# Patient Record
Sex: Male | Born: 1990 | Race: Black or African American | Hispanic: No | Marital: Single | Smoking: Never smoker
Health system: Southern US, Community
[De-identification: ages and names within clinical notes are randomized; demographics above are authoritative.]

---

## 2013-03-15 ENCOUNTER — Emergency Department (INDEPENDENT_AMBULATORY_CARE_PROVIDER_SITE_OTHER)
Admission: EM | Admit: 2013-03-15 | Discharge: 2013-03-15 | Disposition: A | Discharge status: Home / Self Care | Payer: Managed Care, Other (non HMO) | Source: Home / Self Care | Attending: Family Medicine | Admitting: Family Medicine

## 2013-03-15 ENCOUNTER — Encounter (HOSPITAL_COMMUNITY): Payer: Self-pay | Admitting: Emergency Medicine

## 2013-03-15 DIAGNOSIS — L738 Other specified follicular disorders: Secondary | ICD-10-CM

## 2013-03-15 DIAGNOSIS — L678 Other hair color and hair shaft abnormalities: Secondary | ICD-10-CM

## 2013-03-15 DIAGNOSIS — L731 Pseudofolliculitis barbae: Secondary | ICD-10-CM

## 2013-03-15 LAB — POCT URINALYSIS DIP (DEVICE)
Hgb urine dipstick: NEGATIVE
Ketones, ur: NEGATIVE mg/dL
Protein, ur: NEGATIVE mg/dL
Specific Gravity, Urine: 1.025 (ref 1.005–1.030)
pH: 7 (ref 5.0–8.0)

## 2013-03-15 NOTE — ED Notes (Signed)
Patient in a hurry for another appointment-not sure how well he is listening

## 2013-03-15 NOTE — ED Notes (Signed)
Pt c/o 2 bumps on left side of groin area. Pt states the bumps are itching. Pt denies discharge and dysuria. Jan Ranson, SMA

## 2013-03-15 NOTE — ED Provider Notes (Signed)
Edwin Ramos is a 22 y.o. male who presents to Urgent Care today for left ingrown hair and penile pain. Patient noted a small ingrown hair on the left inguinal region. Additionally he had one episode of penile pain following sex last week. He denies any penile discharge testicle pain abdominal pain nausea vomiting or diarrhea. He has never had a sexually-transmitted infection. He feels well otherwise   History reviewed. No pertinent past medical history. History  Substance Use Topics  . Smoking status: Never Smoker   . Smokeless tobacco: Not on file  . Alcohol Use: Not on file   ROS as above Medications reviewed. No current facility-administered medications for this encounter.   No current outpatient prescriptions on file.    Exam:  BP 110/75  Pulse 60  Temp(Src) 98.4 F (36.9 C) (Oral)  Resp 16  SpO2 99% Gen: Well NAD GROIN: Normal appearing circumcised penis. Small band of tissue contacts the glans to the shaft of the penis on the dorsal aspect. No penile discharge.  Normal-appearing scrotum. Testes are normal and nontender.  No hernias present.  Small ingrown hair in the left inguinal area.   Results for orders placed during the hospital encounter of 03/15/13 (from the past 24 hour(s))  POCT URINALYSIS DIP (DEVICE)     Status: Abnormal   Collection Time    03/15/13 11:40 AM      Result Value Range   Glucose, UA NEGATIVE  NEGATIVE mg/dL   Bilirubin Urine NEGATIVE  NEGATIVE   Ketones, ur NEGATIVE  NEGATIVE mg/dL   Specific Gravity, Urine 1.025  1.005 - 1.030   Hgb urine dipstick NEGATIVE  NEGATIVE   pH 7.0  5.0 - 8.0   Protein, ur NEGATIVE  NEGATIVE mg/dL   Urobilinogen, UA 2.0 (*) 0.0 - 1.0 mg/dL   Nitrite NEGATIVE  NEGATIVE   Leukocytes, UA NEGATIVE  NEGATIVE   No results found.  Assessment and Plan: 23 y.o. male with left ingrown hair and penile pain.  Urine culture and urine cytology for gonorrhea Chlamydia Trichomonas are pending.  Warm compress for ingrown  hair.  Followup as needed Will call with result Discussed warning signs or symptoms. Please see discharge instructions. Patient expresses understanding.      Rodolph Bong, MD 03/15/13 [phone]

## 2014-03-17 ENCOUNTER — Encounter (HOSPITAL_COMMUNITY): Payer: Self-pay | Admitting: Emergency Medicine

## 2014-03-17 ENCOUNTER — Emergency Department (HOSPITAL_COMMUNITY): Payer: Managed Care, Other (non HMO)

## 2014-03-17 ENCOUNTER — Emergency Department (HOSPITAL_COMMUNITY)
Admission: EM | Admit: 2014-03-17 | Discharge: 2014-03-17 | Disposition: A | Discharge status: Home / Self Care | Payer: Managed Care, Other (non HMO) | Attending: Emergency Medicine | Admitting: Emergency Medicine

## 2014-03-17 DIAGNOSIS — S8990XA Unspecified injury of unspecified lower leg, initial encounter: Secondary | ICD-10-CM | POA: Diagnosis present

## 2014-03-17 DIAGNOSIS — Y92838 Other recreation area as the place of occurrence of the external cause: Secondary | ICD-10-CM

## 2014-03-17 DIAGNOSIS — Y9361 Activity, american tackle football: Secondary | ICD-10-CM | POA: Insufficient documentation

## 2014-03-17 DIAGNOSIS — S99919A Unspecified injury of unspecified ankle, initial encounter: Secondary | ICD-10-CM | POA: Diagnosis present

## 2014-03-17 DIAGNOSIS — X500XXA Overexertion from strenuous movement or load, initial encounter: Secondary | ICD-10-CM | POA: Insufficient documentation

## 2014-03-17 DIAGNOSIS — S99929A Unspecified injury of unspecified foot, initial encounter: Secondary | ICD-10-CM | POA: Diagnosis not present

## 2014-03-17 DIAGNOSIS — Y9239 Other specified sports and athletic area as the place of occurrence of the external cause: Secondary | ICD-10-CM | POA: Diagnosis not present

## 2014-03-17 DIAGNOSIS — M25561 Pain in right knee: Secondary | ICD-10-CM

## 2014-03-17 MED ORDER — OXYCODONE-ACETAMINOPHEN 5-325 MG PO TABS
1.0000 | ORAL_TABLET | Freq: Once | ORAL | Status: AC
  Administered 2014-03-17: 1 via ORAL
  Filled 2014-03-17: qty 1

## 2014-03-17 MED ORDER — OXYCODONE-ACETAMINOPHEN 5-325 MG PO TABS: 1.0000 | ORAL_TABLET | Freq: Four times a day (QID) | ORAL | Status: AC | PRN

## 2014-03-17 NOTE — ED Provider Notes (Signed)
CSN: [id number]     Arrival date & time 03/17/14  1510 History  This chart was scribed for non-physician practitioner, Francee Piccolo, PA-C working with Linwood Dibbles, MD by Greggory Stallion, ED scribe. This patient was seen in room WTR8/WTR8 and the patient's care was started at 4:28 PM.   Chief Complaint  Patient presents with  . Knee Injury   The history is provided by the patient. No language interpreter was used.   HPI Comments: Edwin Ramos is a 23 y.o. male who presents to the Emergency Department complaining of right knee injury that occurred earlier today. Pt was playing football and felt a pop in right knee while he was running. Denies hitting his head or LOC. Reports sudden onset pain with associated swelling. Bearing weight worsens the pain. He has iced with little relief. Denies prior injury to right knee. Denies numbness or tingling down his leg.   History reviewed. No pertinent past medical history. History reviewed. No pertinent past surgical history. History reviewed. No pertinent family history. History  Substance Use Topics  . Smoking status: Never Smoker   . Smokeless tobacco: Not on file  . Alcohol Use: Not on file    Review of Systems  Musculoskeletal: Positive for arthralgias and joint swelling.  Neurological: Negative for numbness.  All other systems reviewed and are negative.  Allergies  Review of patient's allergies indicates no known allergies.  Home Medications   Prior to Admission medications   Medication Sig Start Date End Date Taking? Authorizing Provider  oxyCODONE-acetaminophen (PERCOCET) 5-325 MG per tablet Take 1-2 tablets by mouth every 6 (six) hours as needed for severe pain. 03/17/14   Claudio Mondry L Jasan Doughtie, PA-C   BP 157/74  Pulse 77  Temp(Src) 98.2 F (36.8 C) (Oral)  Resp 18  SpO2 98%  Physical Exam  Nursing note and vitals reviewed. Constitutional: He is oriented to person, place, and time. He appears well-developed and  well-nourished. No distress.  HENT:  Head: Normocephalic and atraumatic.  Right Ear: External ear normal.  Left Ear: External ear normal.  Nose: Nose normal.  Mouth/Throat: Oropharynx is clear and moist.  Eyes: Conjunctivae are normal.  Neck: Normal range of motion. Neck supple.  Cardiovascular: Normal rate, regular rhythm and normal heart sounds.   Pulmonary/Chest: Effort normal and breath sounds normal. No respiratory distress. He has no wheezes. He has no rales.  Abdominal: Soft.  Musculoskeletal: Normal range of motion.       Right knee: He exhibits swelling and abnormal meniscus. He exhibits no LCL laxity, normal patellar mobility and no MCL laxity. Tenderness found. Lateral joint line tenderness noted.  Negative anterior drawer test of right knee.   Neurological: He is alert and oriented to person, place, and time.  Skin: Skin is warm and dry. He is not diaphoretic.  Psychiatric: He has a normal mood and affect.    ED Course  Procedures (including critical care time) Medications  oxyCODONE-acetaminophen (PERCOCET/ROXICET) 5-325 MG per tablet 1 tablet (1 tablet Oral Given 03/17/14 1647)    DIAGNOSTIC STUDIES: Oxygen Saturation is 98% on RA, normal by my interpretation.    COORDINATION OF CARE: 4:30 PM-Discussed treatment plan which includes xray with pt at bedside and pt agreed to plan. Advised pt that he likely has a meniscal tear. He will be given a knee brace and an orthopedic referral for follow up.   Labs Review Labs Reviewed - No data to display  Imaging Review Dg Knee Complete 4 Views Right  03/17/2014  EXAM: RIGHT KNEE - COMPLETE 4+ VIEW  COMPARISON:  None.  FINDINGS: There is no evidence of fracture or dislocation. No definite joint effusion. There is no evidence of arthropathy or other focal bone abnormality. Soft tissues are unremarkable.  IMPRESSION: Negative.   Electronically Signed   By: Britta Mccreedy M.D.   On: 03/17/2014 16:41     EKG  Interpretation None      MDM   Final diagnoses:  Right knee pain    Filed Vitals:   03/17/14 1511  BP: 157/74  Pulse: 77  Temp: 98.2 F (36.8 C)  Resp: 18   Afebrile, NAD, non-toxic appearing, AAOx4.  Neurovascularly intact. Normal sensation. Patient X-Ray negative for obvious fracture or dislocation. Pain managed in ED. Pt advised to follow up with orthopedics if symptoms persist for possibility of meniscal injury. Patient given brace while in ED, conservative therapy recommended and discussed. Advised orthopedic follow up. Patient will be dc home & is agreeable with above plan. Patient is stable at time of discharge    I personally performed the services described in this documentation, which was scribed in my presence. The recorded information has been reviewed and is accurate.  Jeannetta Ellis, PA-C 03/17/14 1721

## 2014-03-17 NOTE — ED Notes (Signed)
Pt states he was playing football today, running, when he felt/heard a pop in his R knee. No prior hx.

## 2014-03-17 NOTE — Discharge Instructions (Signed)
Please follow up with your primary care physician in 1-2 days. If you do not have one please call the [facility] number listed above. Please follow up with Dr. Roda Shutters to schedule a follow up appointment. Please take pain medication and/or muscle relaxants as prescribed and as needed for pain. Please do not drive on narcotic pain medication or on muscle relaxants. Please read all discharge instructions and return precautions.   Knee, Cartilage (Meniscus) Injury It is suspected that you have a torn cartilage (meniscus) in your knee. The menisci are made of tough cartilage and fit between the surfaces of the thigh and leg bones. The menisci are C-shaped and have a wedged profile. The wedged profile helps the stability of the joint by keeping the rounded femur surface from sliding off the flat tibial surface. The menisci are fed (nourished) by small blood vessels, but there is also a large area at the inner edge of the meniscus that does not have a good blood supply (avascular). This presents a problem when there is an injury to the meniscus because areas without good blood supply heal poorly. As a result when there is a torn cartilage in the knee, surgery is often required to fix it. This is usually done with a surgical procedure less invasive than open surgery (arthroscopy). Some times open surgery of the knee is required if there is other damage. PURPOSE OF THE MENISCUS The medial meniscus rests on the medial tibial plateau. The tibia is the large bone in your lower leg (the shin bone). The medial tibial plateau is the upper end of the bone making up the inner part of your knee. The lateral meniscus serves the same purpose and is located on the outside of the knee. The menisci help to distribute your body weight across the knee joint; they act as shock absorbers. Without the meniscus present, the weight of your body would be unevenly applied to the bones in your legs (the femur and tibia). The  femur is the large bone in your thigh. This uneven weight distribution would cause increased wear and tear on the cartilage lining the joint surfaces, leading to early damage (arthritis) of these areas. The presence of the menisci cartilage is necessary for a healthy knee. PURPOSE OF THE KNEE CARTILAGE The knee joint is made up of three bones: the thigh bone (femur), the shin bone (tibia), and the knee cap (patella). The surfaces of these bones at the knee joint are covered with cartilage called articular cartilage. This smooth, slippery surface allows the bones to slide against each other without causing bone damage. The meniscus sits between these cartilaginous surfaces of the bones. It distributes the weight evenly in the joints and helps with the stability of the joint (keeps the joint steady). HOME CARE INSTRUCTIONS  Use crutches and external braces as instructed.  Once home, an ice pack applied to your injured knee may help with discomfort and keep the swelling down. An ice pack can be used for the first couple of days or as instructed.  Only take over-the-counter or prescription medicines for pain, discomfort, or fever as directed by your caregiver.  Call if you do not have relief of pain with medications or if there is increasing in pain.  Call if your foot becomes cold or blue.  You may resume normal diet and activities as directed.  Make sure to keep your appointments with your follow-up caregiver. This injury may require further evaluation and treatment beyond the  temporary treatment given today. Document Released: 09/11/2002 Document Revised: 11/05/2013 Document Reviewed: 01/03/2009 [facility] Patient Information 2015 [place], [place]. This information is not intended to replace advice given to you by your health care provider. Make sure you discuss any questions you have with your health care provider.

## 2014-03-20 NOTE — ED Provider Notes (Signed)
Medical screening examination/treatment/procedure(s) were performed by non-physician practitioner and as supervising physician I was immediately available for consultation/collaboration.   EKG Interpretation None        Linwood Dibbles, MD 03/20/14 [phone]

## 2014-03-22 ENCOUNTER — Telehealth (HOSPITAL_BASED_OUTPATIENT_CLINIC_OR_DEPARTMENT_OTHER): Payer: Self-pay | Admitting: Emergency Medicine

## 2014-04-12 ENCOUNTER — Other Ambulatory Visit (HOSPITAL_COMMUNITY): Payer: Self-pay | Admitting: Orthopedic Surgery

## 2014-04-30 ENCOUNTER — Inpatient Hospital Stay (HOSPITAL_COMMUNITY): Admission: RE | Admit: 2014-04-30 | Payer: Managed Care, Other (non HMO) | Source: Ambulatory Visit

## 2014-05-07 ENCOUNTER — Ambulatory Visit (HOSPITAL_COMMUNITY)
Admission: RE | Admit: 2014-05-07 | Payer: Managed Care, Other (non HMO) | Source: Ambulatory Visit | Admitting: Orthopedic Surgery

## 2014-05-07 ENCOUNTER — Encounter (HOSPITAL_COMMUNITY): Admission: RE | Payer: Self-pay | Source: Ambulatory Visit

## 2014-05-07 SURGERY — RECONSTRUCTION, KNEE, ACL, USING HAMSTRING GRAFT
Anesthesia: General | Site: Knee | Laterality: Right

## 2015-05-01 IMAGING — CR DG KNEE COMPLETE 4+V*R*
4 series · 4 of 4 positions shown · non-contrast
Comparison: None.

EXAM:
RIGHT KNEE - COMPLETE 4+ VIEW

[x knee ap right (1 of 3)]
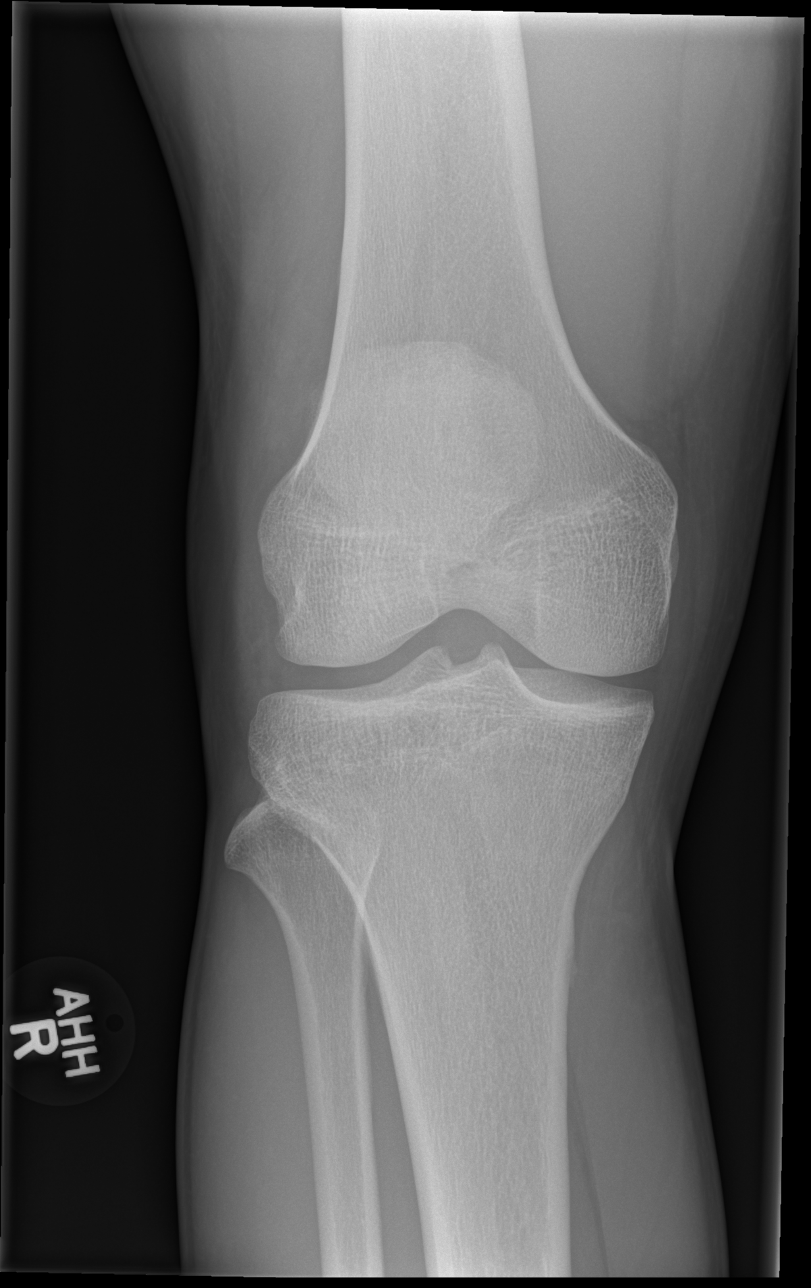

[x knee ap right (2 of 3)]
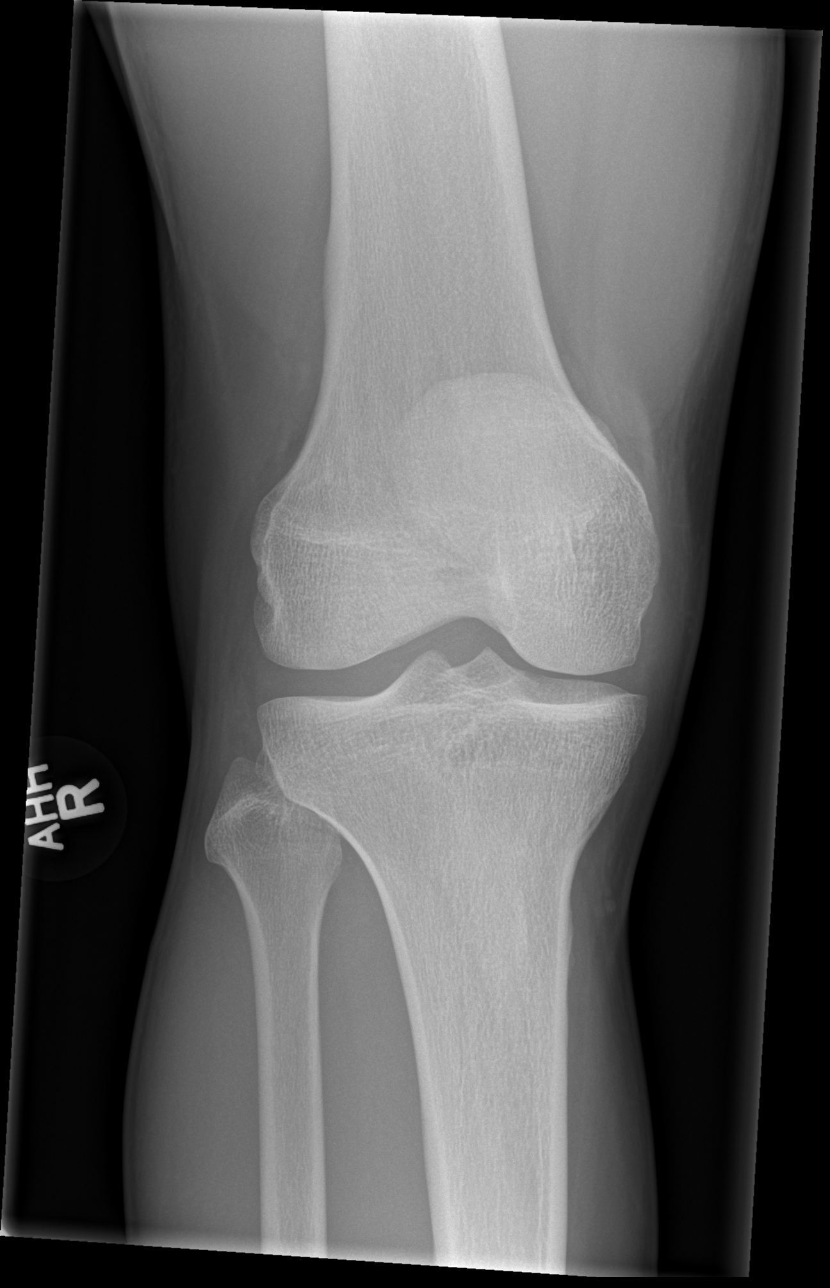

[x knee ap right (3 of 3)]
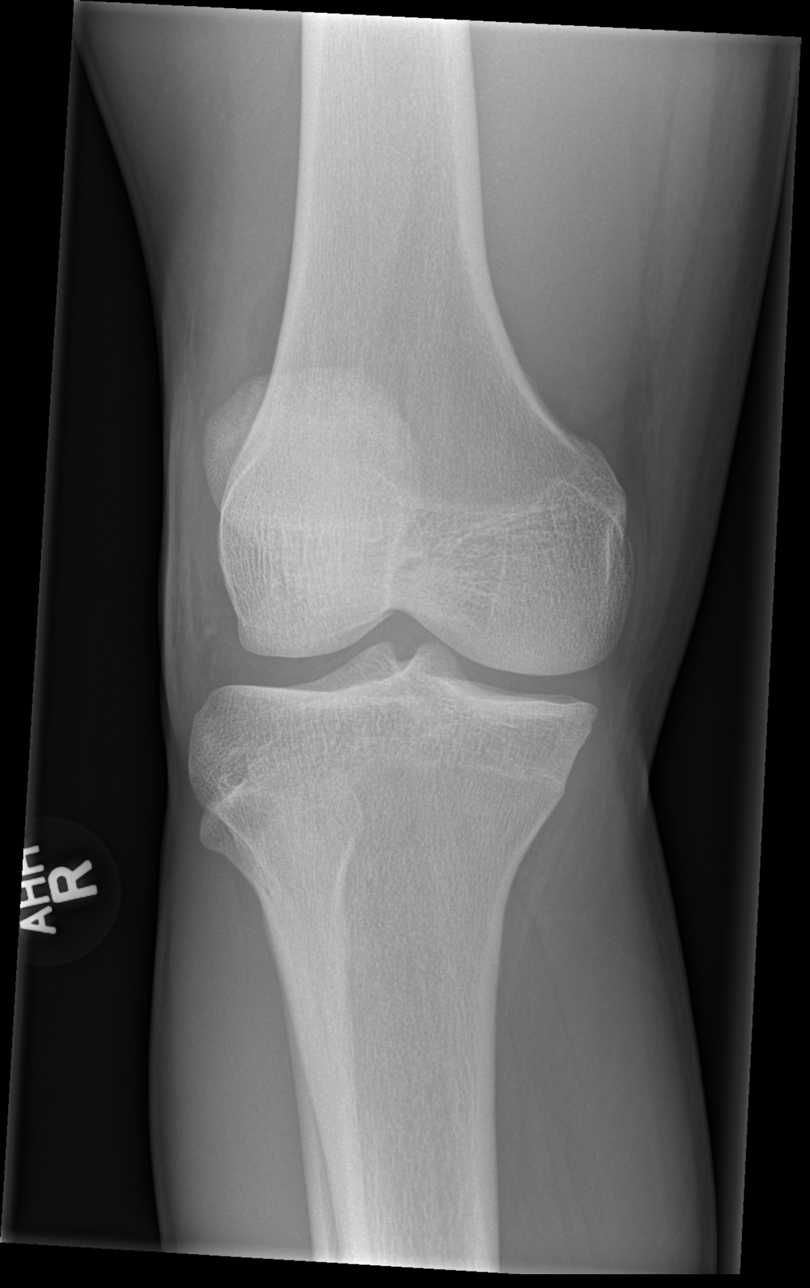

[x knee lat right]
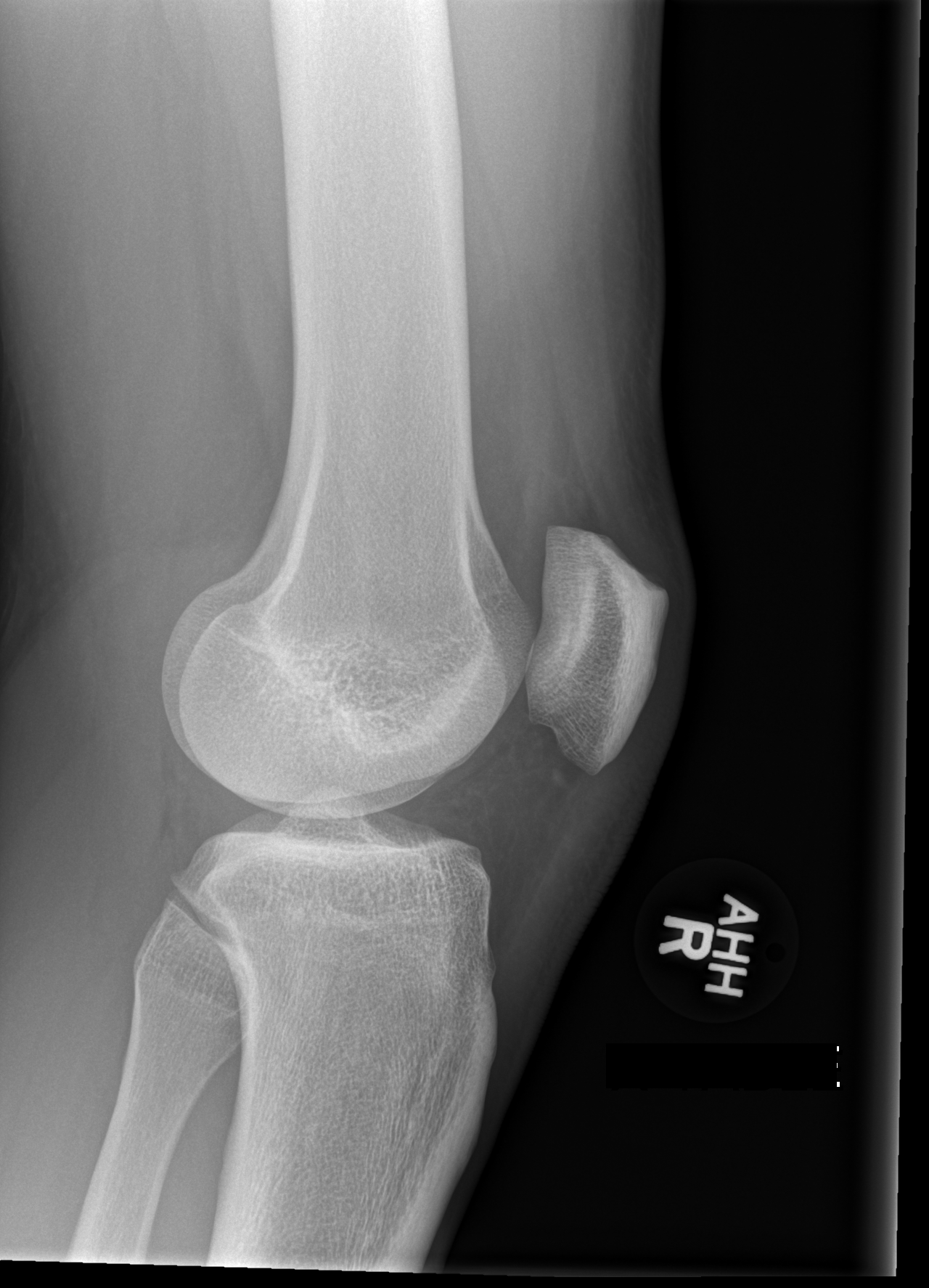

[4 of 4 positions shown; findings below may reference images not displayed]

FINDINGS: There is no evidence of fracture or dislocation. No definite joint
effusion. There is no evidence of arthropathy or other focal bone
abnormality. Soft tissues are unremarkable.
IMPRESSION: Negative.
